# Patient Record
Sex: Male | Born: 1935 | Marital: Married
Health system: Southern US, Community
[De-identification: ages and names within clinical notes are randomized; demographics above are authoritative.]

---

## 2008-03-05 ENCOUNTER — Ambulatory Visit: Payer: Self-pay | Admitting: Ophthalmology

## 2008-03-12 ENCOUNTER — Ambulatory Visit: Payer: Self-pay | Admitting: Ophthalmology

## 2008-03-18 ENCOUNTER — Ambulatory Visit: Payer: Self-pay | Admitting: Ophthalmology

## 2009-01-10 ENCOUNTER — Ambulatory Visit: Payer: Self-pay | Admitting: Internal Medicine

## 2010-09-28 ENCOUNTER — Observation Stay: Payer: Self-pay | Admitting: Internal Medicine

## 2010-10-05 ENCOUNTER — Ambulatory Visit: Payer: Self-pay | Admitting: Cardiology

## 2010-10-09 ENCOUNTER — Ambulatory Visit: Payer: Self-pay | Admitting: Cardiology

## 2011-01-06 ENCOUNTER — Inpatient Hospital Stay: Payer: Self-pay | Admitting: Internal Medicine

## 2011-01-06 DIAGNOSIS — K625 Hemorrhage of anus and rectum: Secondary | ICD-10-CM

## 2011-01-11 LAB — PATHOLOGY REPORT

## 2011-05-25 ENCOUNTER — Ambulatory Visit: Payer: Self-pay | Admitting: Ophthalmology

## 2011-05-31 ENCOUNTER — Ambulatory Visit: Payer: Self-pay | Admitting: Ophthalmology

## 2012-05-06 DEATH — deceased

## 2012-11-07 IMAGING — CR DG CHEST 1V PORT
1 series · 2 of 2 positions shown · non-contrast
Comparison: none

REASON FOR EXAM: weakness
COMMENTS:

PROCEDURE:     DXR - DXR PORTABLE CHEST SINGLE VIEW  - September 28, 2010 [DATE]
RESULT:     The lung fields are clear. The heart, mediastinal and osseous
structures show no significant abnormalities. Monitoring electrodes are
present.

[Series 1: view not recorded · 0.17mm/px · 2 of 2 slices shown]
[im 1/2]
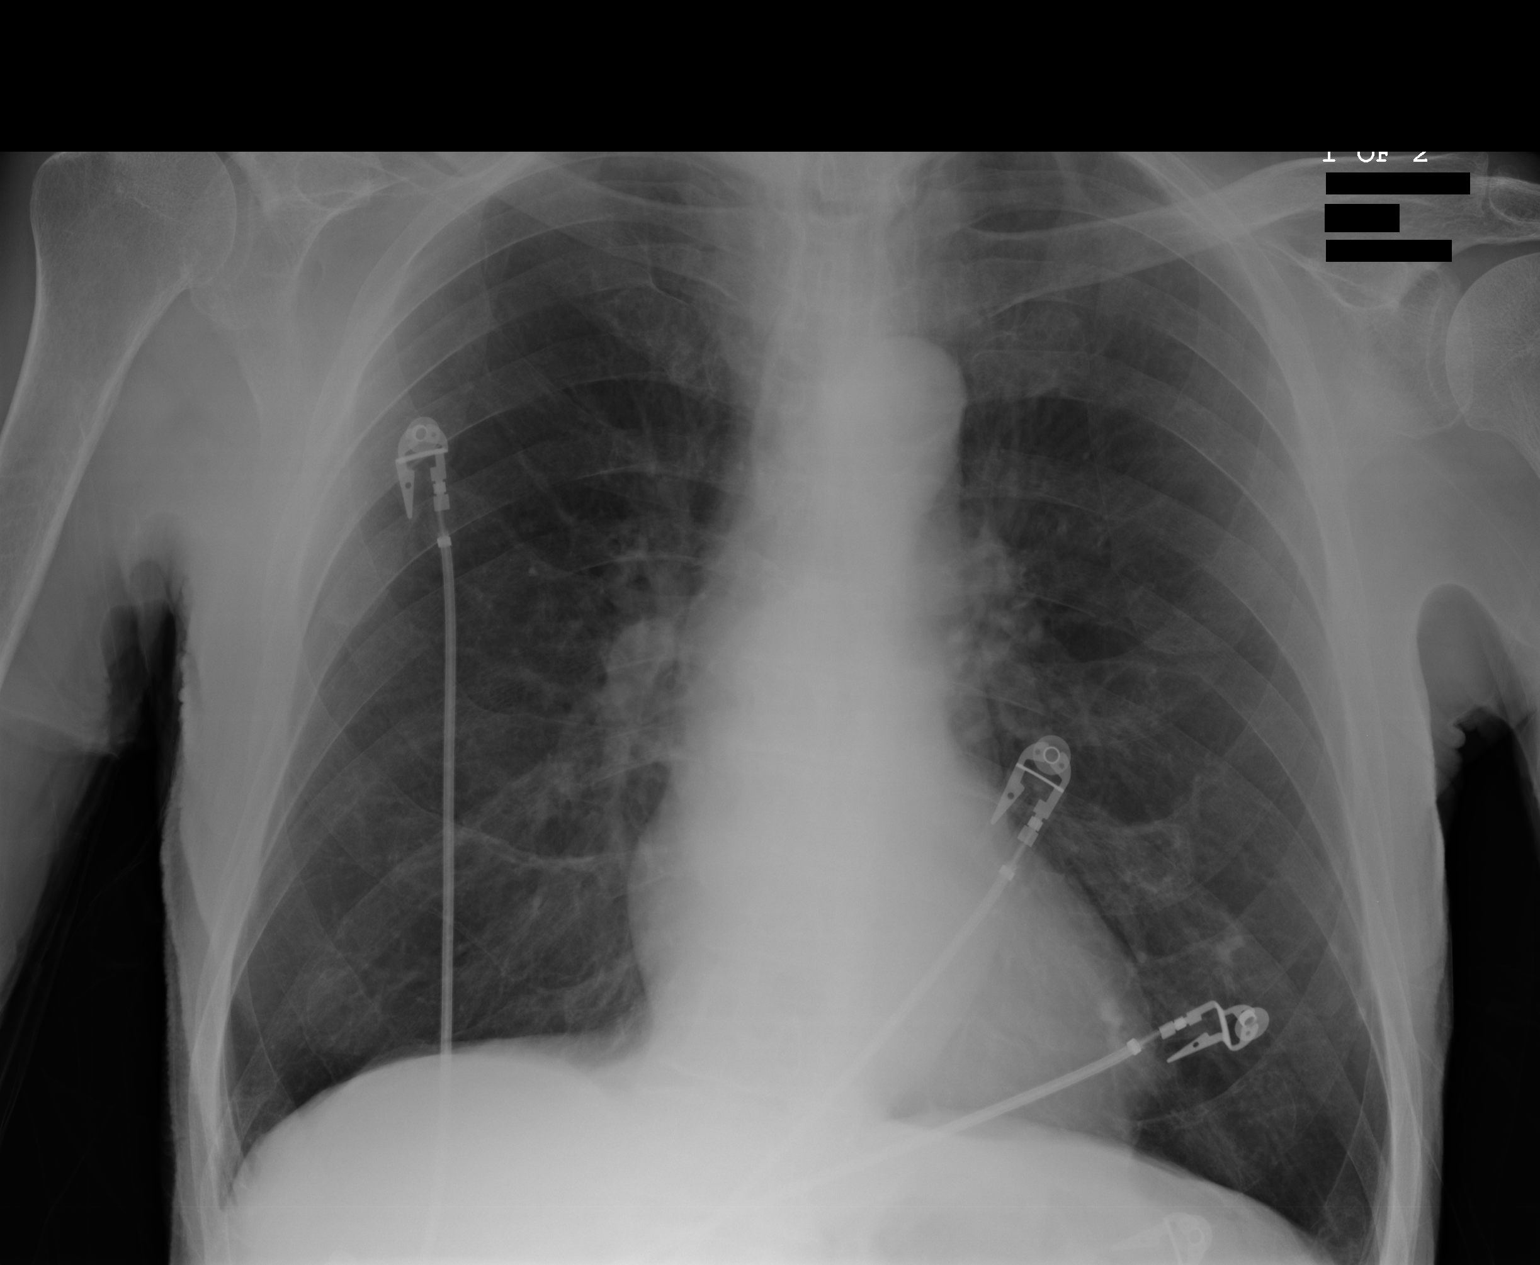
[im 2/2]
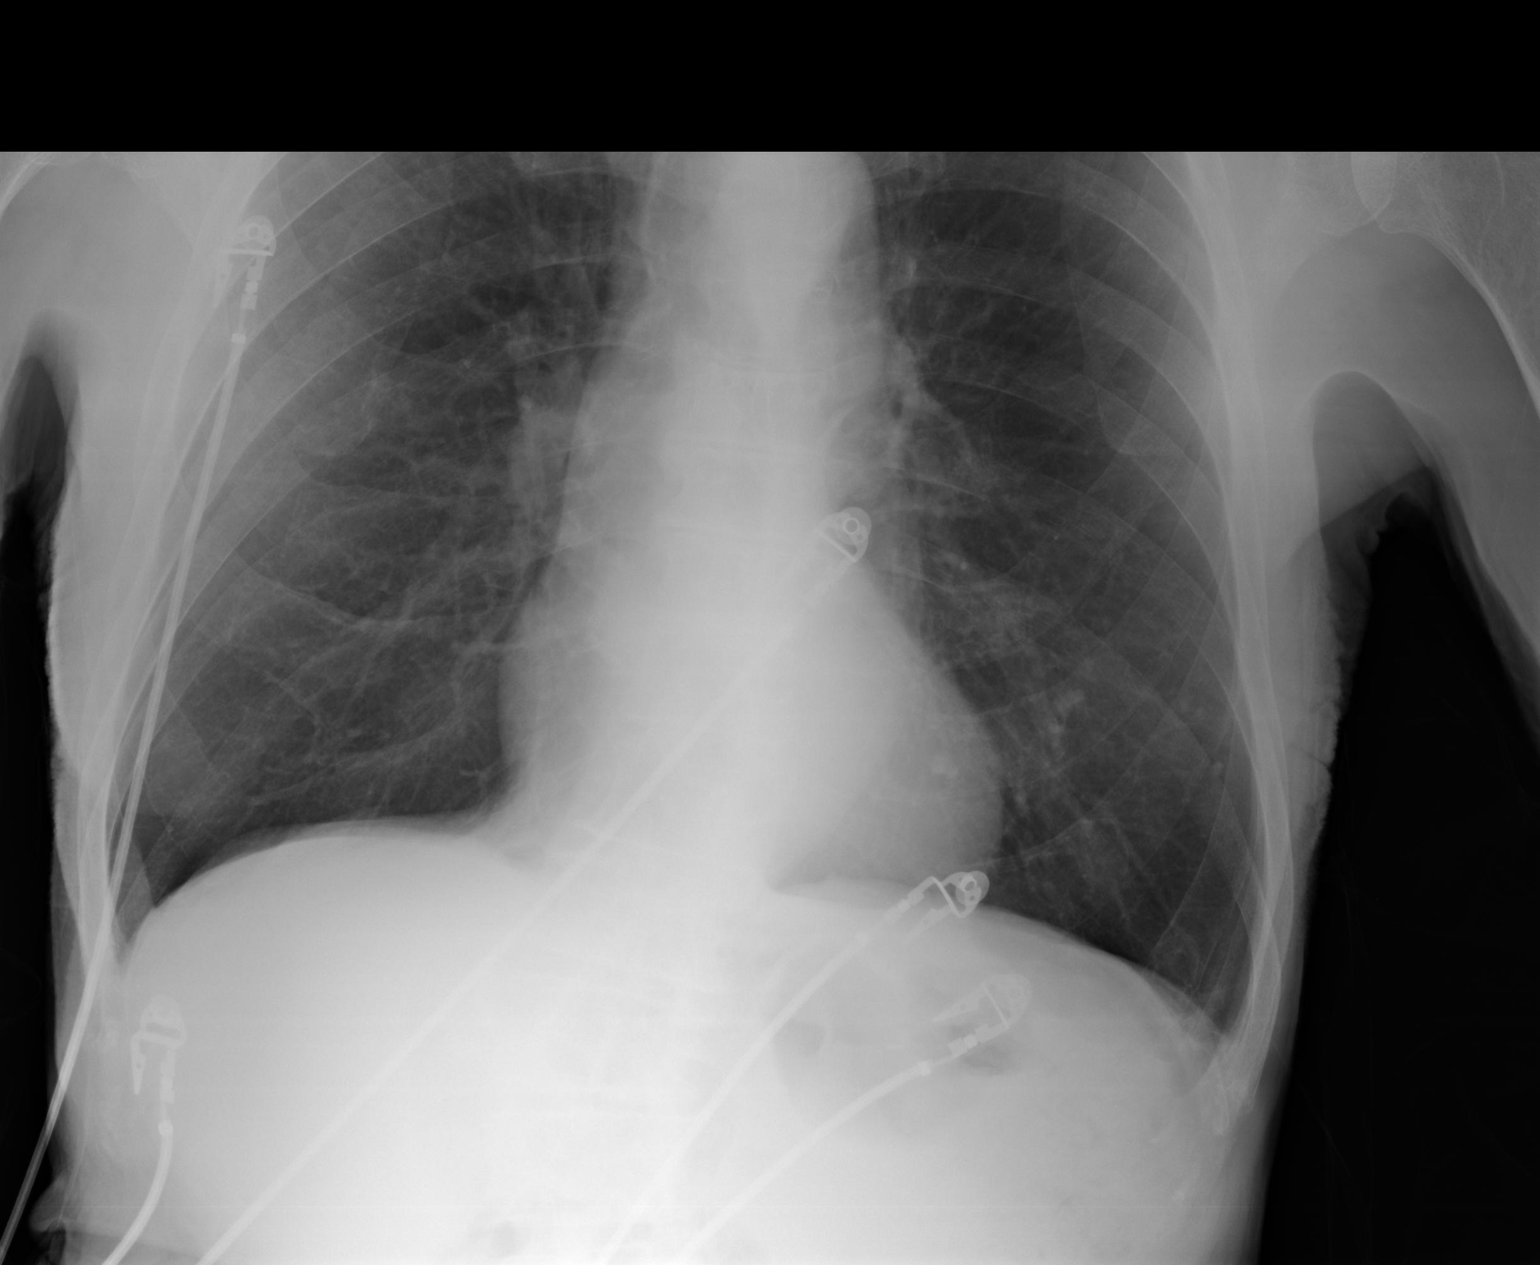

[2 of 2 positions shown; findings below may reference images not displayed]

IMPRESSION: 1.     No acute changes are identified.

## 2012-11-14 IMAGING — CR DG CHEST 2V
1 series · 2 of 2 positions shown · non-contrast
Comparison: none

REASON FOR EXAM: HTN
COMMENTS:   LMP: (Male)

PROCEDURE:     DXR - DXR CHEST PA (OR AP) AND LATERAL  - October 05, 2010  [DATE]
RESULT:     Comparison: 09/28/2010

[Series 1: view not recorded · 0.17mm/px · 2 of 2 slices shown]
[im 1/2]
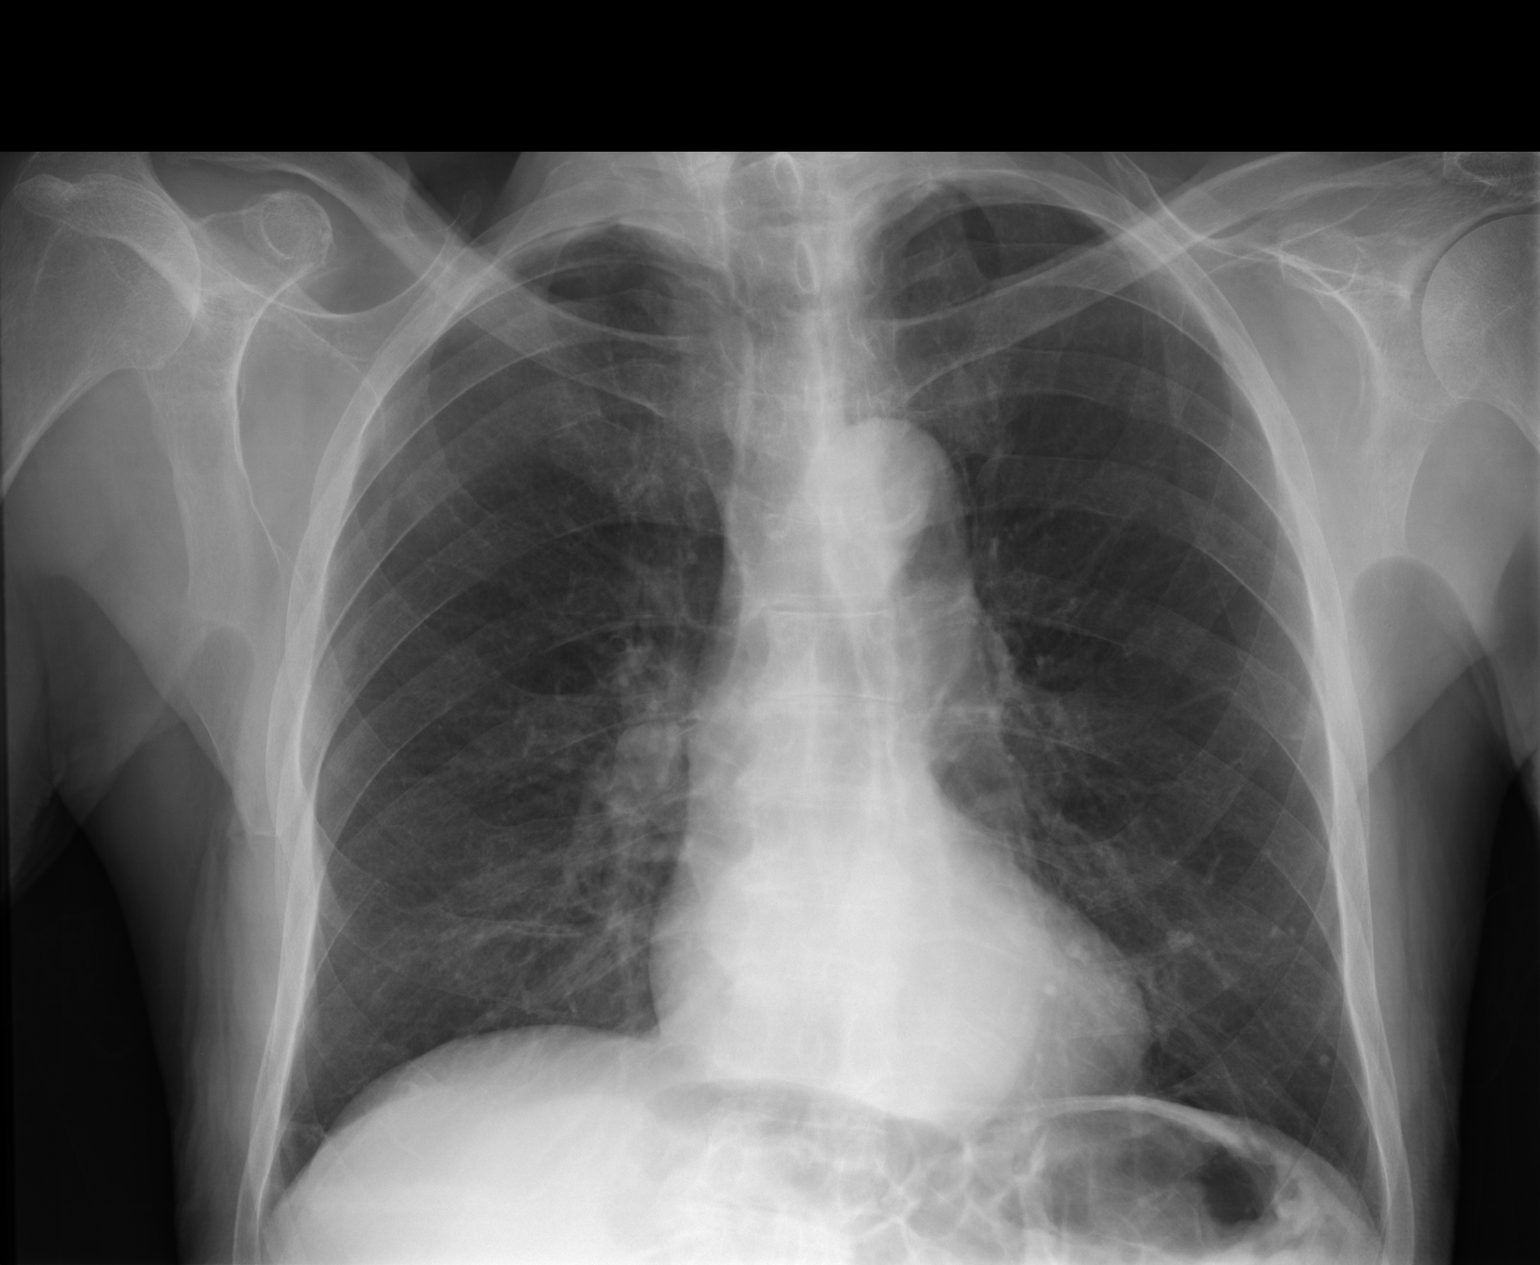
[im 2/2]
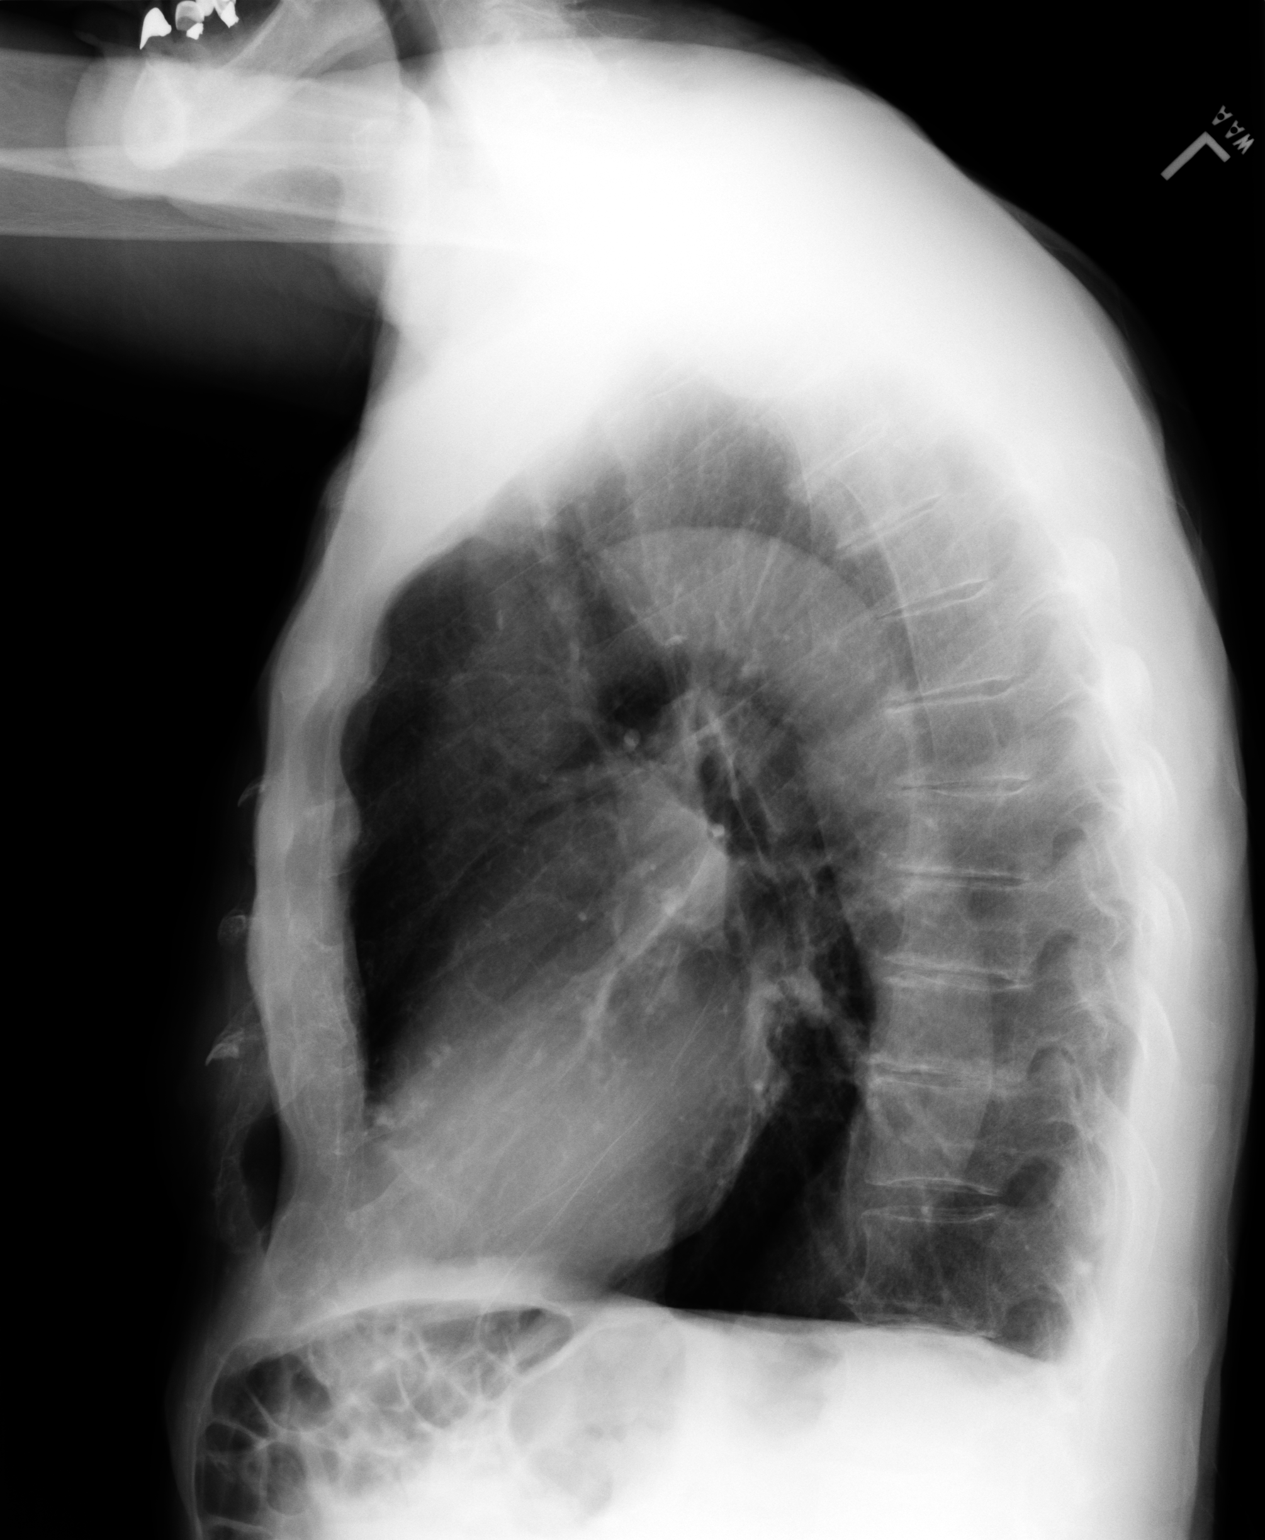

[2 of 2 positions shown; findings below may reference images not displayed]

FINDINGS: Heart normal in size. Aorta is mildly tortuous. Small round densities in the
left lower lung are unchanged from recent prior likely calcified given their
density to small size. There is mild hyperinflation. The lungs are otherwise
clear.
IMPRESSION: 1. No acute cardiopulmonary disease.
2. Small nodular density in the left lower lobe likely calcified given their
density to small size, and sequela of old prior infection. However,
continued followup is suggested to ensure stability.

## 2012-11-18 IMAGING — CR DG CHEST 1V PORT
1 series · 1 of 1 positions shown · non-contrast
Comparison: none

REASON FOR EXAM: Pacemaker
COMMENTS:

[view not recorded]
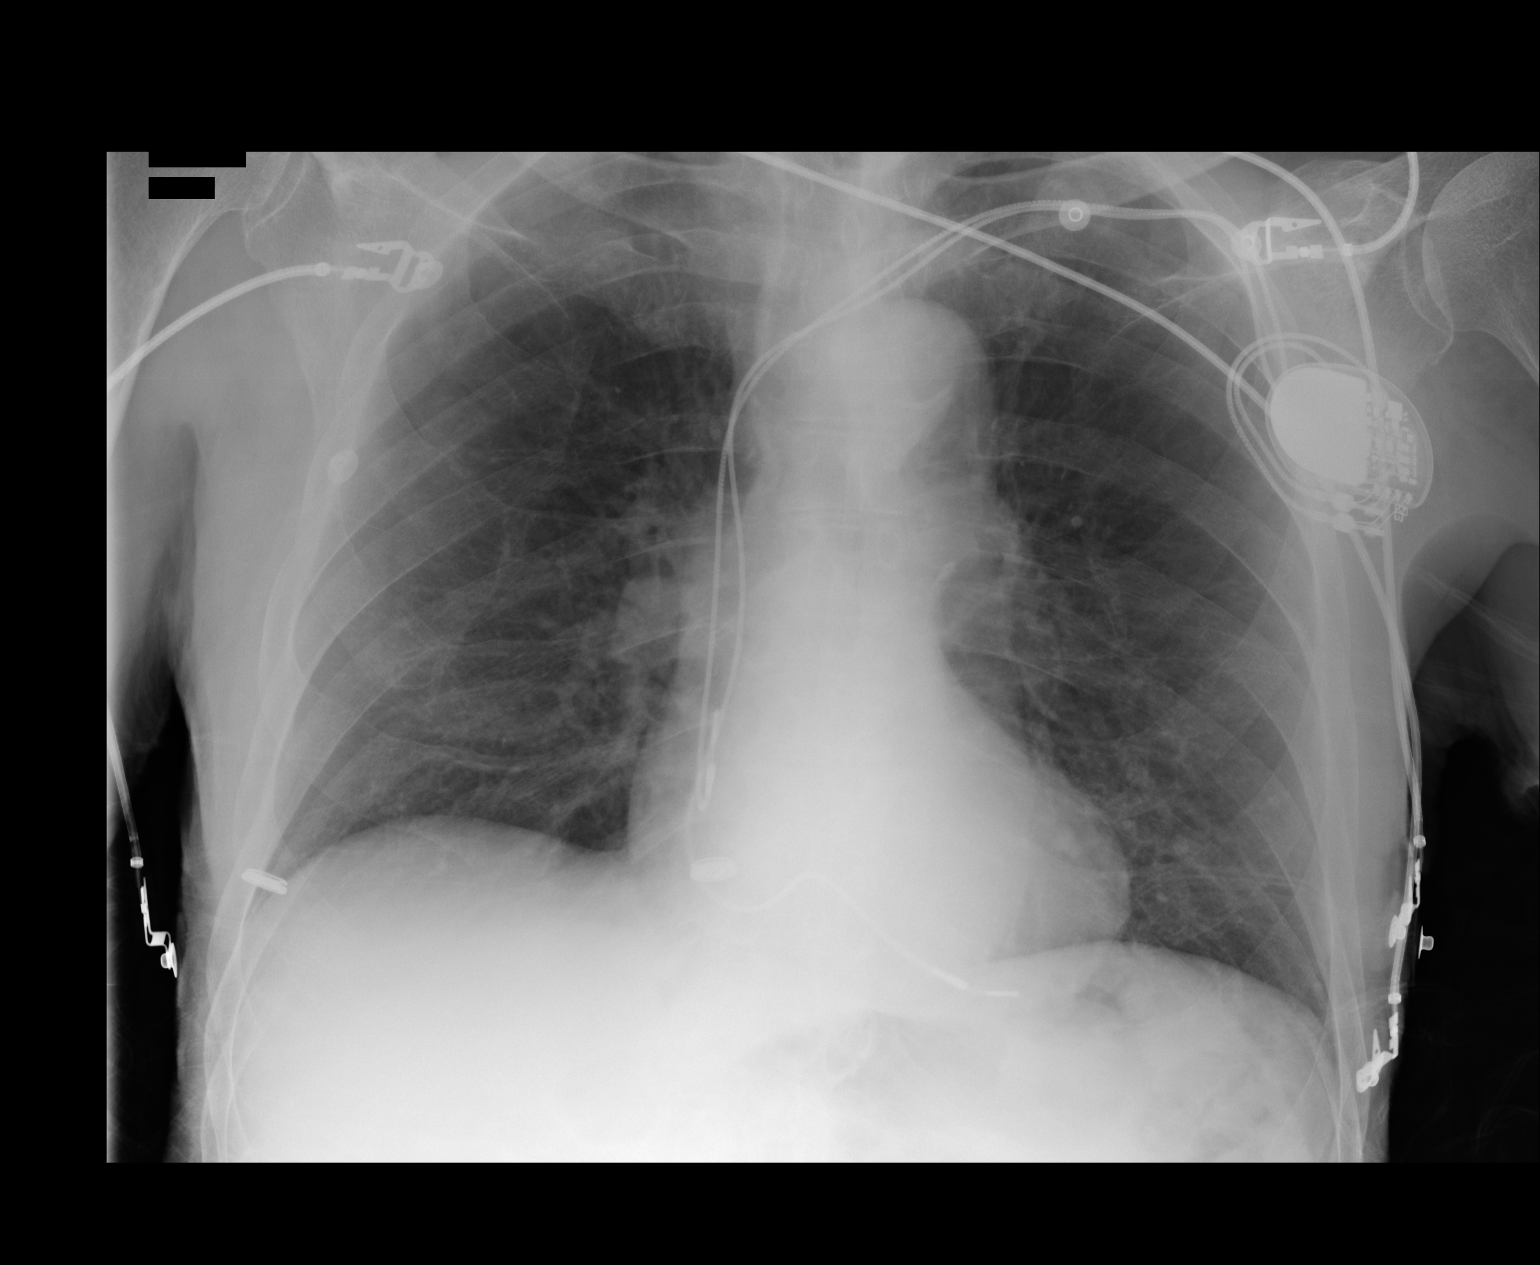

[1 of 1 positions shown; findings below may reference images not displayed]

PROCEDURE:     DXR - DXR PORTABLE CHEST SINGLE VIEW  - October 09, 2010 [DATE]

RESULT:     Since the previous study of 10/05/2010, a left sided pacemaker has
been placed. Monitoring electrodes are present. Two pacemaker leads are in
place with one in the right atrium and the second in the right ventricle.
There is right lung base presumed atelectasis. A definite pneumothorax is
not identified. Linear density at the left lung base suggests atelectasis as
well. The heart is normal in size.
IMPRESSION: Basilar atelectasis bilaterally. Interval placement of a
left sided pacemaker device with two leads in position.

## 2013-02-15 IMAGING — CR DG CHEST 1V PORT
1 series · 1 of 1 positions shown · non-contrast
Comparison: none

REASON FOR EXAM: GI BLEED
COMMENTS:

[view not recorded]
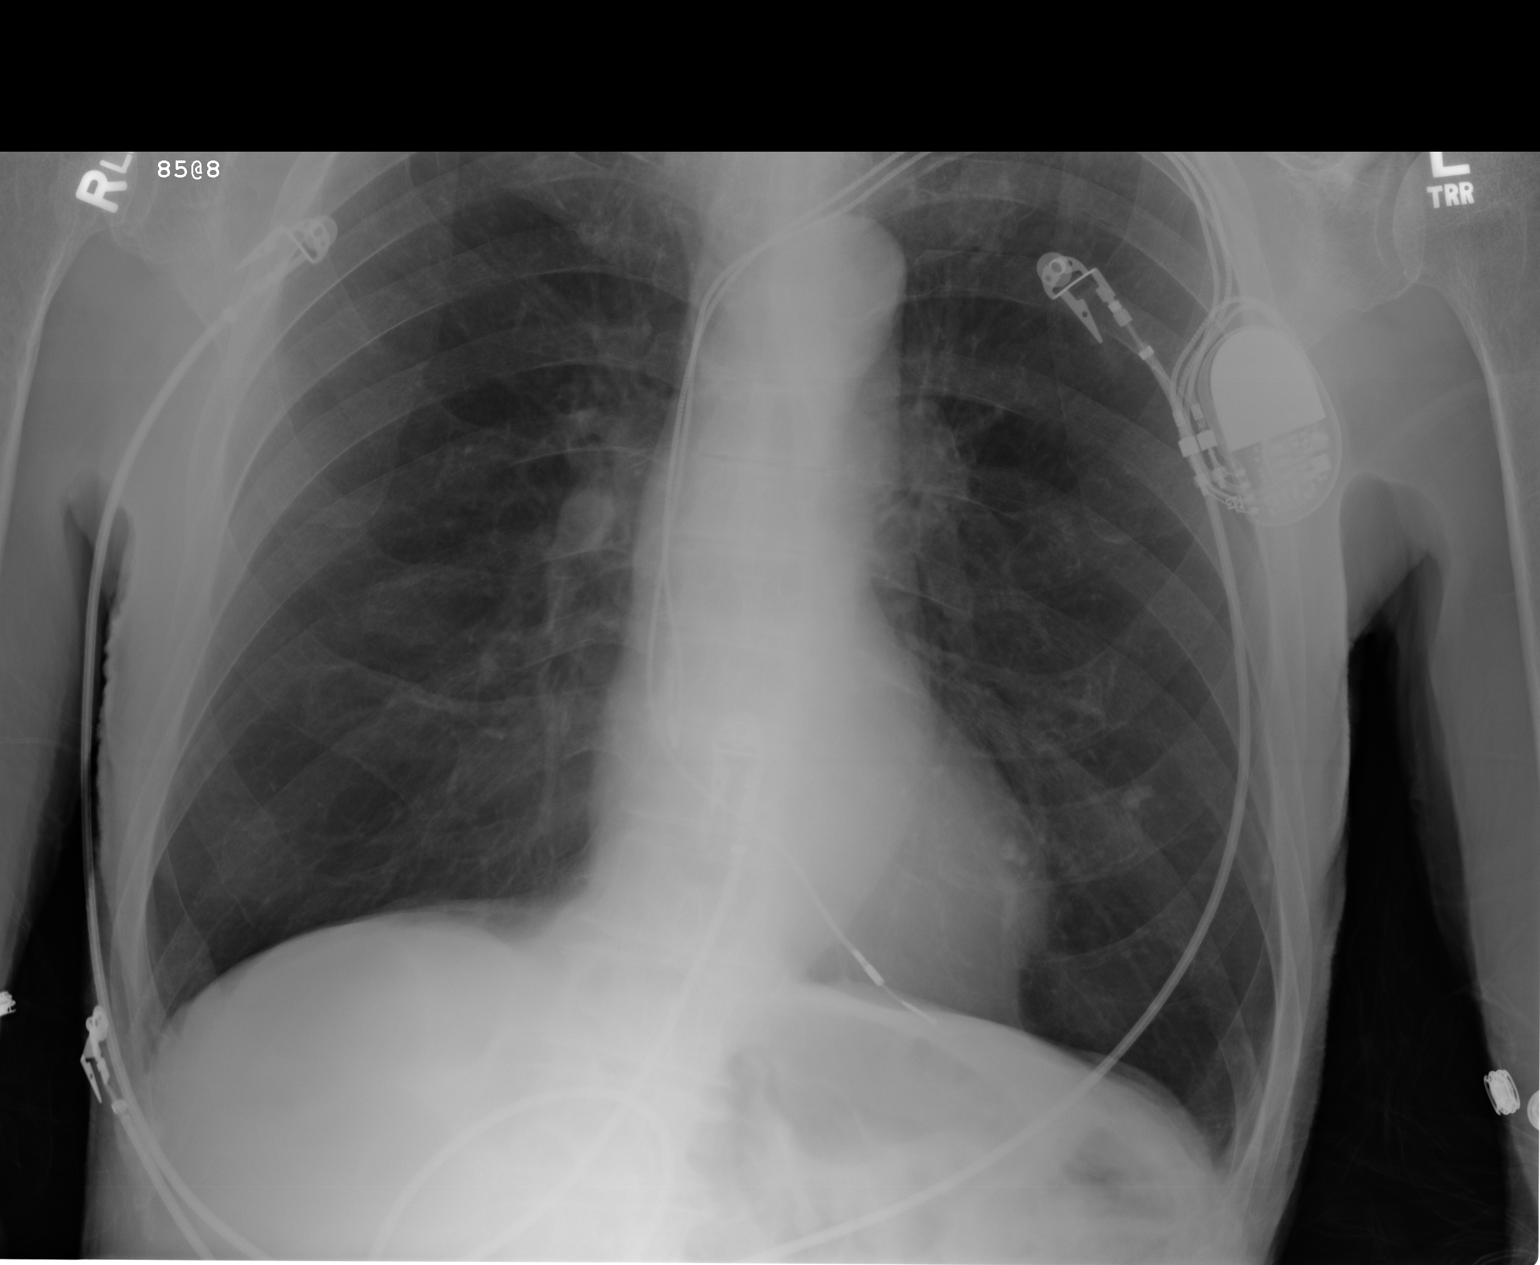

[1 of 1 positions shown; findings below may reference images not displayed]

PROCEDURE:     DXR - DXR PORTABLE CHEST SINGLE VIEW  - January 06, 2011  [DATE]

RESULT:     Comparison is made to the study 09 October, 2010.

The lungs remain hyperinflated. The interstitial markings are less
conspicuous today. The cardiac silhouette is normal in size. There is
tortuosity of the descending thoracic aorta. A permanent pacemaker is in
place.
IMPRESSION: The findings are consistent with COPD. I see no evidence of
CHF nor atelectasis nor pneumonia.

## 2014-07-28 NOTE — Op Note (Signed)
PATIENT NAMJuliette Mangle:  Olson, Scott MR#:  [id number] DATE OF BIRTH:  [birth year]  DATE OF PROCEDURE:  05/31/2011  PREOPERATIVE DIAGNOSIS:  Cataract, left eye.    POSTOPERATIVE DIAGNOSIS:  Cataract, left eye.  PROCEDURE PERFORMED:  Extracapsular cataract extraction using phacoemulsification with placement of an Alcon [id number], 22.5-diopter posterior chamber lens, serial # [id number].  SURGEON:  Maylon PeppersSteven A. Kamayah Pillay, MD  ASSISTANT:  None.  ANESTHESIA:  4% lidocaine and 0.75% Marcaine in a 50/50 mixture with 10 units per mL of Hylenex added, given as a peribulbar.  ANESTHESIOLOGIST:  Dr. Maisie Fushomas   COMPLICATIONS:  None.  ESTIMATED BLOOD LOSS:  Less than 1 mL.  DESCRIPTION OF PROCEDURE:  The patient was brought to the operating room and given a peribulbar block.  The patient was then prepped and draped in the usual fashion.  The vertical rectus muscles were imbricated using 5-0 silk sutures.  These sutures were then clamped to the sterile drapes as bridle sutures.  A limbal peritomy was performed extending two clock hours and hemostasis was obtained with cautery.  A partial thickness scleral groove was made at the surgical limbus and dissected anteriorly in a lamellar dissection using an Alcon crescent knife.  The anterior chamber was entered supero-temporally with a Superblade and through the lamellar dissection with a 2.6 mm keratome.  DisCoVisc was used to replace the aqueous and a continuous tear capsulorrhexis was carried out.  Hydrodissection and hydrodelineation were carried out with balanced salt and a 27 gauge canula.  The nucleus was rotated to confirm the effectiveness of the hydrodissection.  Phacoemulsification was carried out using a divide-and-conquer technique.  Total ultrasound time was 1 minute and 50.7 seconds with an average power of 21.94 percent, CDE 37.52.  Irrigation/aspiration was used to remove the residual cortex.  DisCoVisc was used to inflate the capsule and the internal  incision was enlarged to 3 mm with the crescent knife.  The intraocular lens was folded and inserted into the capsular bag using the Acrysert delivery system.  Irrigation/aspiration was used to remove the residual DisCoVisc.  Miostat was injected into the anterior chamber through the paracentesis track to inflate the anterior chamber and induce miosis.  The wound was checked for leaks and none were found. The conjunctiva was closed with cautery and the bridle sutures were removed.  Two drops of 0.3% Vigamox were placed on the eye.   An eye shield was placed on the eye.  The patient was discharged to the recovery room in good condition.  ____________________________ Maylon PeppersSteven A. Law Corsino, MD sad:drc D: 05/31/2011 12:51:33 ET T: 05/31/2011 13:06:40 ET JOB#: [id number]  cc: Viviann SpareSteven A. Maury Groninger, MD, <Dictator> Erline LevineSTEVEN A Mirza Kidney MD ELECTRONICALLY SIGNED 06/01/2011 15:08
